# Patient Record
Sex: Male | Born: 1995 | Hispanic: No | Marital: Single | Smoking: Never smoker
Health system: Southern US, Community
[De-identification: ages and names within clinical notes are randomized; demographics above are authoritative.]

---
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
## 2018-06-02 ENCOUNTER — Emergency Department (HOSPITAL_COMMUNITY): Payer: Self-pay

## 2018-06-02 ENCOUNTER — Emergency Department (HOSPITAL_COMMUNITY)
Admission: EM | Admit: 2018-06-02 | Discharge: 2018-06-02 | Disposition: A | Discharge status: Home / Self Care | Payer: Self-pay | Attending: Emergency Medicine | Admitting: Emergency Medicine

## 2018-06-02 ENCOUNTER — Encounter (HOSPITAL_COMMUNITY): Payer: Self-pay | Admitting: Emergency Medicine

## 2018-06-02 DIAGNOSIS — J069 Acute upper respiratory infection, unspecified: Secondary | ICD-10-CM | POA: Insufficient documentation

## 2018-06-02 DIAGNOSIS — B9789 Other viral agents as the cause of diseases classified elsewhere: Secondary | ICD-10-CM | POA: Insufficient documentation

## 2018-06-02 LAB — GROUP A STREP BY PCR: Group A Strep by PCR: NOT DETECTED

## 2018-06-02 MED ORDER — FLUTICASONE PROPIONATE 50 MCG/ACT NA SUSP: 1.0000 | Freq: Every day | NASAL | 0 refills | Status: AC

## 2018-06-02 MED ORDER — BENZONATATE 100 MG PO CAPS: 100.0000 mg | ORAL_CAPSULE | Freq: Three times a day (TID) | ORAL | 0 refills | Status: AC

## 2018-06-02 NOTE — ED Notes (Signed)
Pt alert and oriented in NAD. Pt verbalized understanding of discharge instructions. 

## 2018-06-02 NOTE — ED Triage Notes (Signed)
Pt states he has had cough/nasal congestion/ scratchy throat for 3 days.

## 2018-06-02 NOTE — ED Notes (Signed)
Patient transported to X-ray 

## 2018-06-02 NOTE — ED Provider Notes (Signed)
[facility] EMERGENCY DEPARTMENT Provider Note   CSN: [id number] Arrival date & time: 06/02/18  7829     History   Chief Complaint Chief Complaint  Patient presents with  . Nasal Congestion    HPI Jose Reid is a 22 y.o. male who presents for evaluation of 4 days of nasal congestion, rhinorrhea, cough, sore throat.  Patient states the cough is productive of green and yellow sputum.  Patient reports he has had some postnasal drip that has caused him to have sore throat.  He states that he has been able to eat and drink without any difficulty but does report that his pain is worse with swallowing.  He is tolerating secretions without any difficulty.  Patient states he took over-the-counter TheraFlu with no improvement in his symptoms prompting ED visit.  Patient states that he has had some associated chest soreness with his cough.  Patient denies any fevers, difficulty breathing, abdominal pain, nausea/vomiting.  The history is provided by the patient.    History reviewed. No pertinent past medical history.  There are no active problems to display for this patient.   History reviewed. No pertinent surgical history.      Home Medications    Prior to Admission medications   Medication Sig Start Date End Date Taking? Authorizing Provider  benzonatate (TESSALON) 100 MG capsule Take 1 capsule (100 mg total) by mouth every 8 (eight) hours. 06/02/18   Maxwell Caul, PA-C  fluticasone (FLONASE) 50 MCG/ACT nasal spray Place 1 spray into both nostrils daily. 06/02/18   Maxwell Caul, PA-C    Family History History reviewed. No pertinent family history.  Social History Social History   Tobacco Use  . Smoking status: Never Smoker  . Smokeless tobacco: Never Used  Substance Use Topics  . Alcohol use: Yes  . Drug use: Yes    Types: Marijuana     Allergies   Patient has no known allergies.   Review of Systems Review of Systems  Constitutional:  Negative for fever.  HENT: Positive for congestion and sore throat. Negative for drooling and trouble swallowing.   Respiratory: Positive for cough.   Cardiovascular: Negative for chest pain.  Gastrointestinal: Negative for abdominal pain, nausea and vomiting.     Physical Exam Updated Vital Signs BP 121/77 (BP Location: Right Arm)   Pulse 75   Temp 98.1 F (36.7 C) (Oral)   Resp 18   Ht 5\' 7"  (1.702 m)   Wt 65.8 kg (145 lb)   SpO2 99%   BMI 22.71 kg/m   Physical Exam  Constitutional: He appears well-developed and well-nourished.  HENT:  Head: Normocephalic and atraumatic.  Nose: Nose normal.  Mouth/Throat: Uvula is midline and mucous membranes are normal. No trismus in the jaw. Posterior oropharyngeal erythema present.  Airways patent, phonation is intact.  Posterior oropharynx slightly erythematous.  No edema.  No exudates.  Uvula is midline.  No trismus.  Eyes: Conjunctivae and EOM are normal. Right eye exhibits no discharge. Left eye exhibits no discharge. No scleral icterus.  Pulmonary/Chest: Effort normal and breath sounds normal.  Lungs clear to auscultation bilaterally.  Symmetric chest rise.  No wheezing, rales, rhonchi.  Neurological: He is alert.  Skin: Skin is warm and dry.  Psychiatric: He has a normal mood and affect. His speech is normal and behavior is normal.  Nursing note and vitals reviewed.    ED Treatments / Results  Labs (all labs ordered are listed, but only  abnormal results are displayed) Labs Reviewed  GROUP A STREP BY PCR  CULTURE, GROUP A STREP [facility]    EKG None  Radiology Dg Chest 2 View  Result Date: 06/02/2018 CLINICAL DATA:  Chest pain, shortness of breath. EXAM: CHEST - 2 VIEW COMPARISON:  None. FINDINGS: The heart size and mediastinal contours are within normal limits. Both lungs are clear. No pneumothorax or pleural effusion is noted. The visualized skeletal structures are unremarkable. IMPRESSION: No active cardiopulmonary  disease. Electronically Signed   By: Lupita RaiderJames  Green Jr, M.D.   On: 06/02/2018 12:11    Procedures Procedures (including critical care time)  Medications Ordered in ED Medications - No data to display   Initial Impression / Assessment and Plan / ED Course  I have reviewed the triage vital signs and the nursing notes.  Pertinent labs & imaging results that were available during my care of the patient were reviewed by me and considered in my medical decision making (see chart for details).     22 year old male who presents for evaluation of 4 days of nasal congestion, rhinorrhea, sore throat.  No fevers, difficulty breathing.  Reports also cough and associated chest soreness.  Cough is productive.  Patient's been able to tolerate secretions without any difficulty. Patient is afebrile, non-toxic appearing, sitting comfortably on examination table. Vital signs reviewed and stable.  On exam, lungs clear to auscultation bilaterally.  No evidence of respiratory distress.  Posterior pharyngeal slightly erythematous but no evidence of exudates, edema.  History/physical exam is not concerning for Ludwig angina or peritonsillar abscess.  Consider viral URI versus bronchitis.  Low suspicion for pneumonia but also consideration.  Plan for rapid strep, chest x-ray.  Rapid strep reviewed. Negative. CXR reviewed. Negative for any acute infectious etiology.  Discussed results with patient.  Will plan to provide supportive at home therapies for patient.  Encourage primary care follow-up. Patient had ample opportunity for questions and discussion. All patient's questions were answered with full understanding. Strict return precautions discussed. Patient expresses understanding and agreement to plan.    Final Clinical Impressions(s) / ED Diagnoses   Final diagnoses:  Viral URI with cough    ED Discharge Orders        Ordered    fluticasone (FLONASE) 50 MCG/ACT nasal spray  Daily     06/02/18 1244     benzonatate (TESSALON) 100 MG capsule  Every 8 hours     06/02/18 1244       Maxwell CaulLayden, Leng Montesdeoca A, PA-C 06/02/18 1250    Mancel BaleWentz, Elliott, MD 06/03/18 1523

## 2018-06-02 NOTE — Discharge Instructions (Signed)
You can take Tylenol or Ibuprofen as directed for pain. You can alternate Tylenol and Ibuprofen every 4 hours. If you take Tylenol at 1pm, then you can take Ibuprofen at 5pm. Then you can take Tylenol again at 9pm.   He continues Tessalon Perles for the cough.  Make sure you are staying hydrated with plenty of fluids.  Return to emergency department for any fever, vomiting, inability to eat or drink anything, difficulty breathing or any other worsening concerning symptoms.

## 2019-11-27 ENCOUNTER — Other Ambulatory Visit: Payer: Self-pay

## 2019-11-27 ENCOUNTER — Emergency Department (HOSPITAL_COMMUNITY)
Admission: EM | Admit: 2019-11-27 | Discharge: 2019-11-27 | Disposition: A | Discharge status: Home / Self Care | Payer: Self-pay | Attending: Emergency Medicine | Admitting: Emergency Medicine

## 2019-11-27 ENCOUNTER — Encounter (HOSPITAL_COMMUNITY): Payer: Self-pay | Admitting: *Deleted

## 2019-11-27 DIAGNOSIS — K0889 Other specified disorders of teeth and supporting structures: Secondary | ICD-10-CM | POA: Insufficient documentation

## 2019-11-27 DIAGNOSIS — Z79899 Other long term (current) drug therapy: Secondary | ICD-10-CM | POA: Insufficient documentation

## 2019-11-27 MED ORDER — PENICILLIN V POTASSIUM 250 MG PO TABS
500.0000 mg | ORAL_TABLET | Freq: Once | ORAL | Status: AC
  Administered 2019-11-27: 05:00:00 500 mg via ORAL
  Filled 2019-11-27: qty 2

## 2019-11-27 MED ORDER — IBUPROFEN 800 MG PO TABS: 800.0000 mg | ORAL_TABLET | Freq: Three times a day (TID) | ORAL | 0 refills | Status: DC | PRN

## 2019-11-27 MED ORDER — IBUPROFEN 800 MG PO TABS
800.0000 mg | ORAL_TABLET | Freq: Once | ORAL | Status: AC
  Administered 2019-11-27: 800 mg via ORAL
  Filled 2019-11-27: qty 1

## 2019-11-27 MED ORDER — PENICILLIN V POTASSIUM 500 MG PO TABS: 500.0000 mg | ORAL_TABLET | Freq: Four times a day (QID) | ORAL | 0 refills | Status: DC

## 2019-11-27 NOTE — ED Notes (Signed)
Toothache for months

## 2019-11-27 NOTE — ED Triage Notes (Signed)
Left upper dental pain "for months".

## 2019-11-27 NOTE — ED Provider Notes (Signed)
TIME SEEN: 5:26 AM  CHIEF COMPLAINT: Upper left dental pain x2 months  HPI: Patient is a 23 year old male with no significant past medical history who presents to the emergency department left upper molar pain for the past 2 months.  States pain worsened today and he thought his tooth could be infected and is requesting antibiotics.  States it is causing him to have a headache.  No fevers, no facial swelling, difficulty swallowing, difficulty breathing, difficulty speaking.  Does not have a dentist.  ROS: See HPI Constitutional: no fever  Eyes: no drainage  ENT: no runny nose   Cardiovascular:  no chest pain  Resp: no SOB  GI: no vomiting GU: no dysuria Integumentary: no rash  Allergy: no hives  Musculoskeletal: no leg swelling  Neurological: no slurred speech ROS otherwise negative  PAST MEDICAL HISTORY/PAST SURGICAL HISTORY:  History reviewed. No pertinent past medical history.  MEDICATIONS:  Prior to Admission medications   Medication Sig Start Date End Date Taking? Authorizing Provider  benzonatate (TESSALON) 100 MG capsule Take 1 capsule (100 mg total) by mouth every 8 (eight) hours. 06/02/18   Volanda Napoleon, PA-C  fluticasone (FLONASE) 50 MCG/ACT nasal spray Place 1 spray into both nostrils daily. 06/02/18   Volanda Napoleon, PA-C  ibuprofen (ADVIL) 800 MG tablet Take 1 tablet (800 mg total) by mouth every 8 (eight) hours as needed for mild pain. 11/27/19   Othello Dickenson, Delice Bison, DO  penicillin v potassium (VEETID) 500 MG tablet Take 1 tablet (500 mg total) by mouth 4 (four) times daily. 11/27/19   Daneil Beem, Delice Bison, DO    ALLERGIES:  No Known Allergies  SOCIAL HISTORY:  Social History   Tobacco Use  . Smoking status: Never Smoker  . Smokeless tobacco: Never Used  Substance Use Topics  . Alcohol use: Yes    FAMILY HISTORY: No family history on file.  EXAM: BP 130/80 (BP Location: Right Arm)   Pulse 80   Temp 98.7 F (37.1 C) (Oral)   Resp 18   SpO2 98%   CONSTITUTIONAL: Alert and oriented and responds appropriately to questions. Well-appearing; well-nourished HEAD: Normocephalic EYES: Conjunctivae clear, pupils appear equal, EOM appear intact ENT: normal nose; moist mucous membranes; No pharyngeal erythema or petechiae, no tonsillar hypertrophy or exudate, no uvular deviation, no unilateral swelling, no trismus or drooling, no muffled voice, normal phonation, no stridor, patient has dental decay noted to the left upper posterior molar without swelling around the gumline, no drainable dental abscess noted, no Ludwig's angina, tongue sits flat in the bottom of the mouth, no angioedema, no facial erythema or warmth, no facial swelling; no pain with movement of the neck, no cervical LAD. NECK: Supple, normal ROM, no lymphadenopathy CARD: RRR; S1 and S2 appreciated; no murmurs, no clicks, no rubs, no gallops RESP: Normal chest excursion without splinting or tachypnea; breath sounds clear and equal bilaterally; no wheezes, no rhonchi, no rales, no hypoxia or respiratory distress, speaking full sentences ABD/GI: Abdomen nondistended BACK: Normal range of motion EXT: Normal ROM in all joints; no deformity noted, no edema; no cyanosis SKIN: Normal color for age and race; warm; no rash on exposed skin NEURO: Moves all extremities equally PSYCH: The patient's mood and manner are appropriate.   MEDICAL DECISION MAKING: Patient here with dental pain likely from dental caries.  No drainable abscess on exam.  No Ludwick's angina.  We will start him on penicillin prophylactically recommended alternating Tylenol and ibuprofen.  Will give outpatient dental resources.  Patient well-appearing, nontoxic, afebrile.  He is comfortable with this plan.  At this time, I do not feel there is any life-threatening condition present. I have reviewed, interpreted and discussed all results (EKG, imaging, lab, urine as appropriate) and exam findings with patient/family. I have  reviewed nursing notes and appropriate previous records.  I feel the patient is safe to be discharged home without further emergent workup and can continue workup as an outpatient as needed. Discussed usual and customary return precautions. Patient/family verbalize understanding and are comfortable with this plan.  Outpatient follow-up has been provided as needed. All questions have been answered.    SHIGEO BAUGH was evaluated in Emergency Department on 11/27/2019 for the symptoms described in the history of present illness. He was evaluated in the context of the global COVID-19 pandemic, which necessitated consideration that the patient might be at risk for infection with the SARS-CoV-2 virus that causes COVID-19. Institutional protocols and algorithms that pertain to the evaluation of patients at risk for COVID-19 are in a state of rapid change based on information released by regulatory bodies including the CDC and federal and state organizations. These policies and algorithms were followed during the patient's care in the ED.  Patient was seen wearing N95, face shield, gloves.    Zair Borawski, Layla Maw, DO 11/27/19 [phone]

## 2019-12-12 IMAGING — DX DG CHEST 2V
2 series · 2 of 2 positions shown · non-contrast
Comparison: None.

CLINICAL DATA: Chest pain, shortness of breath.

EXAM:
CHEST - 2 VIEW

[chest pa]
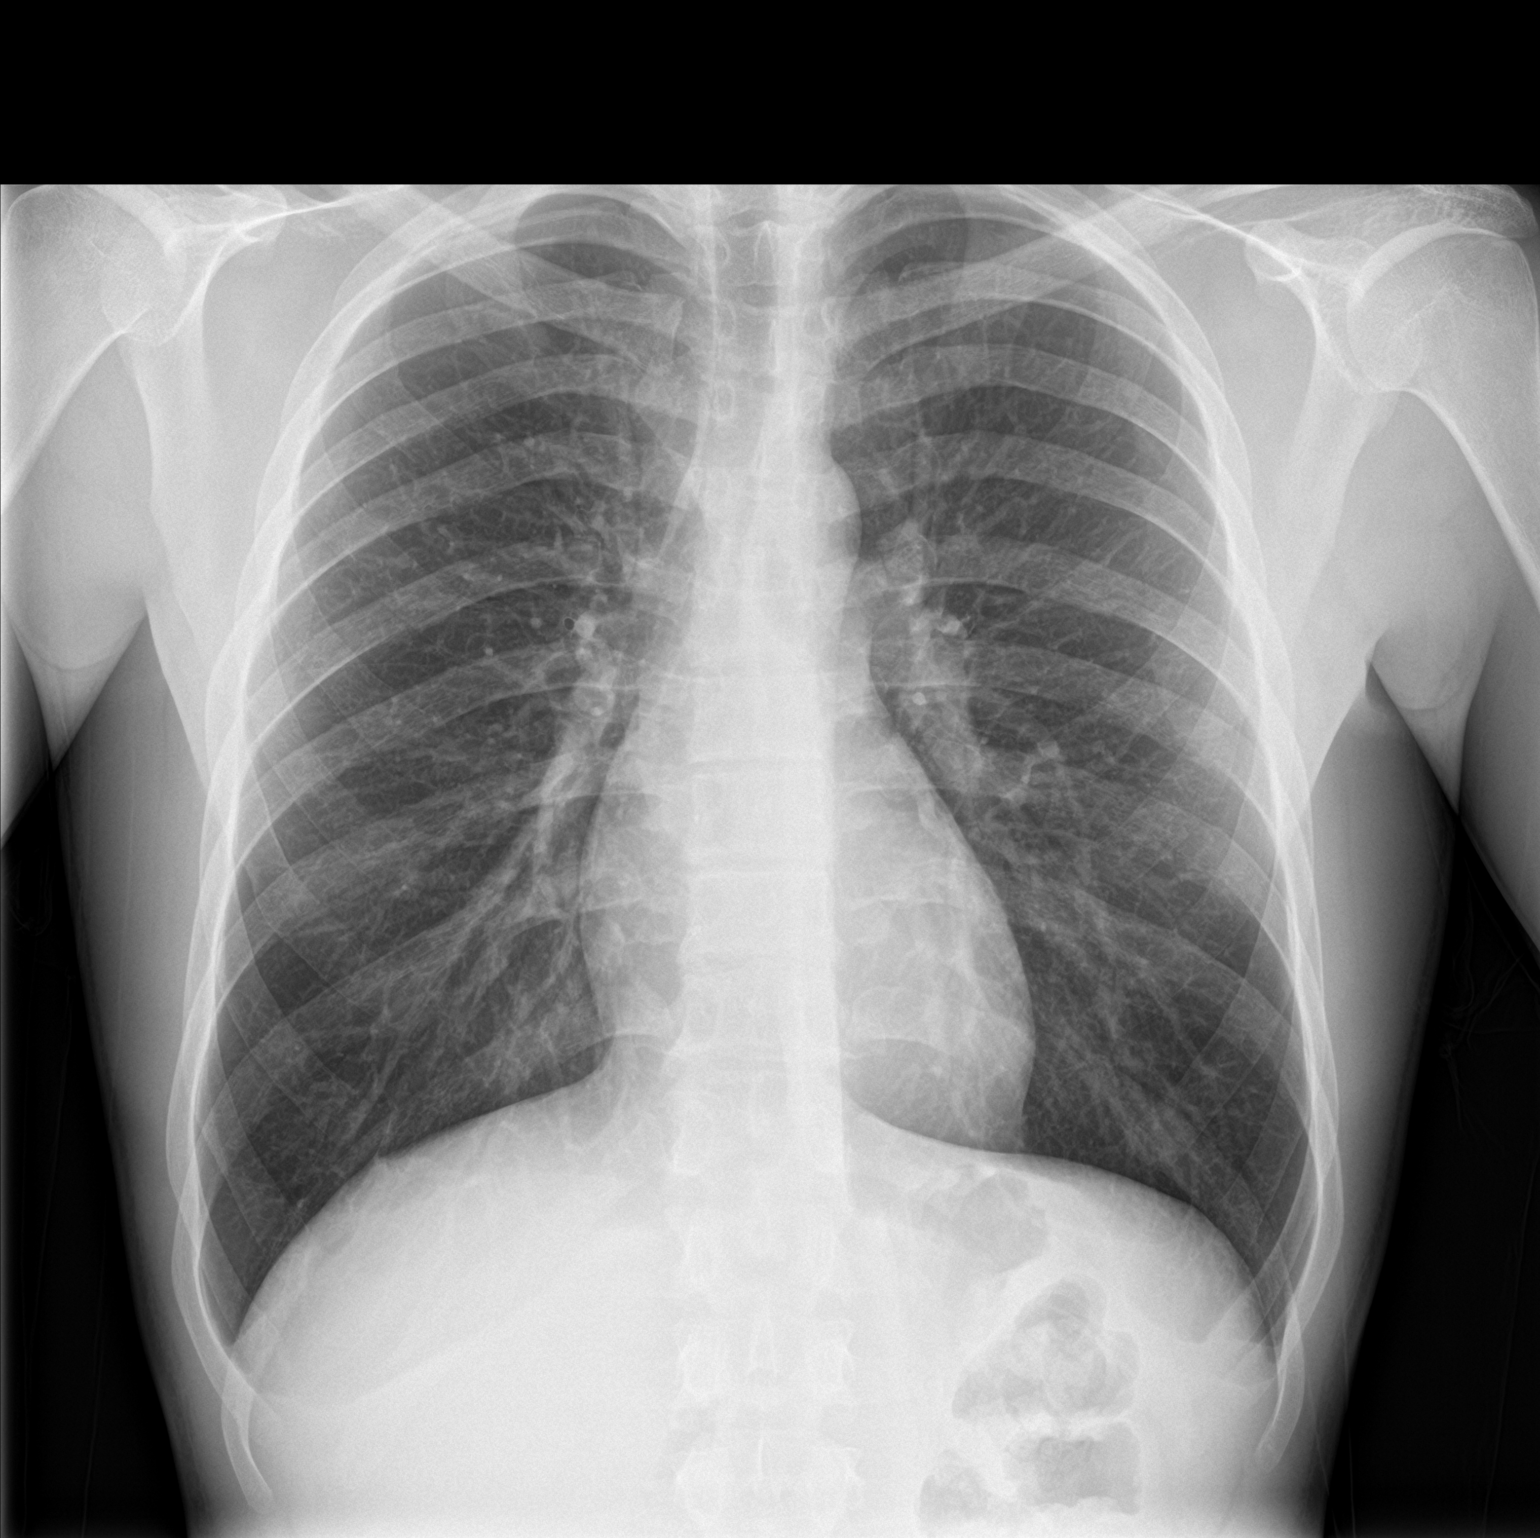

[chest lat]
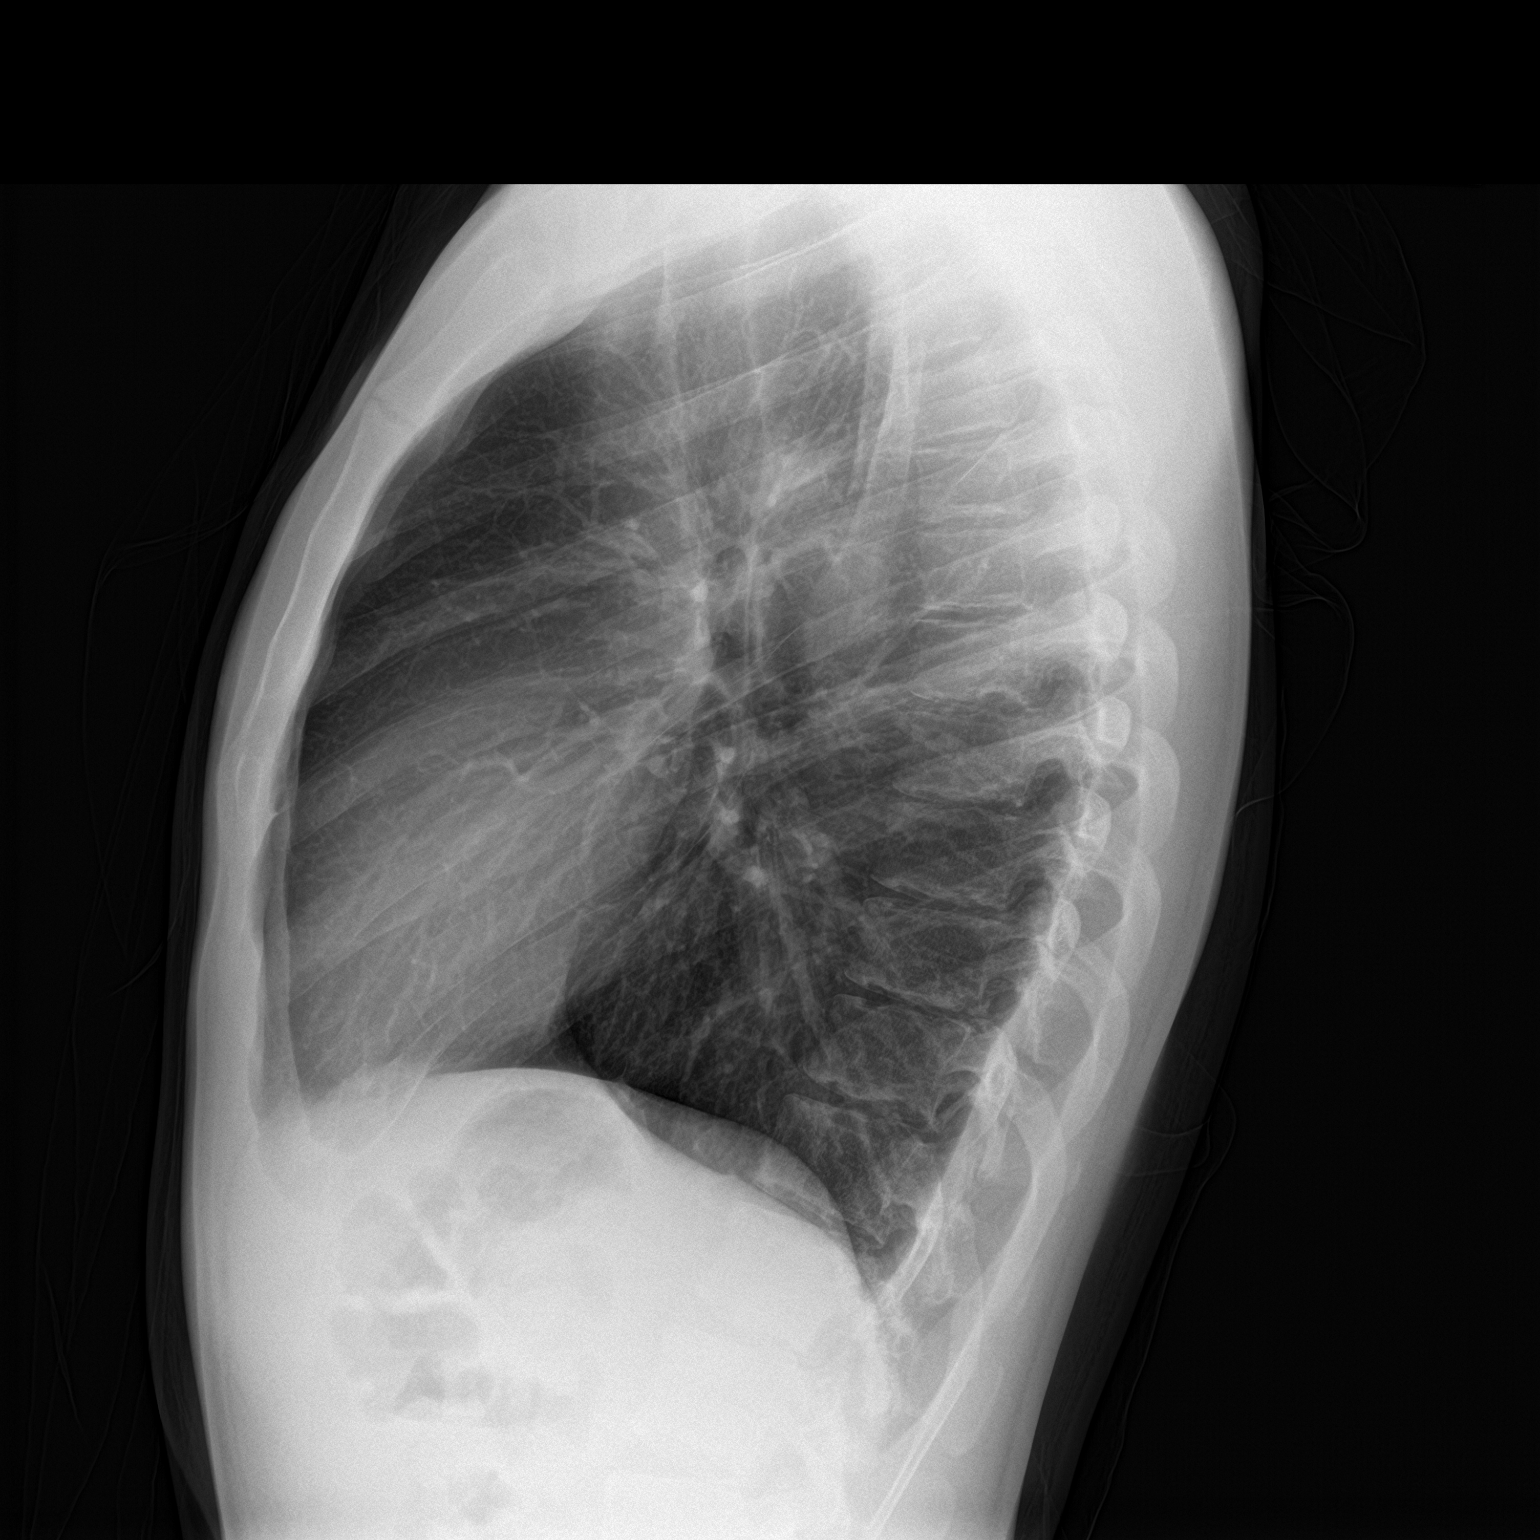

[2 of 2 positions shown; findings below may reference images not displayed]

FINDINGS: The heart size and mediastinal contours are within normal limits.
Both lungs are clear. No pneumothorax or pleural effusion is noted.
The visualized skeletal structures are unremarkable.
IMPRESSION: No active cardiopulmonary disease.

## 2021-07-29 ENCOUNTER — Emergency Department (HOSPITAL_COMMUNITY)
Admission: EM | Admit: 2021-07-29 | Discharge: 2021-07-29 | Disposition: A | Discharge status: Home / Self Care | Payer: Self-pay | Attending: Emergency Medicine | Admitting: Emergency Medicine

## 2021-07-29 DIAGNOSIS — K0889 Other specified disorders of teeth and supporting structures: Secondary | ICD-10-CM | POA: Insufficient documentation

## 2021-07-29 MED ORDER — CHLORHEXIDINE GLUCONATE 0.12 % MT SOLN: 15.0000 mL | Freq: Two times a day (BID) | OROMUCOSAL | 0 refills | Status: AC

## 2021-07-29 NOTE — ED Provider Notes (Signed)
[facility] EMERGENCY DEPARTMENT Provider Note   CSN: [id number] Arrival date & time: 07/29/21  0208     History No chief complaint on file.   Jose Reid is a 25 y.o. male.  Jose Reid is a 25 y.o. male otherwise healthy, presents to the ED for evaluation of dental pain.  He reports he has been having issues with his left upper most posterior molar which is broken and decaying at the gumline.  He recently saw a dental clinic for this and was prescribed amoxicillin which she took for 7 days and seemed to improve his pain, completed course of antibiotics yesterday and now pain seems to be returning tonight.  He did take 1 dose of 800 mg ibuprofen with minimal relief in pain and has tried Orajel without much improvement.  He is concerned that pain returned soon as he completed antibiotics.  Patient not able to get an appointment with the dental clinic to have the tooth until September.  No fevers or chills.  No facial swelling.  No difficulty swallowing.  No other aggravating or alleviating factors.  The history is provided by the patient.      No past medical history on file.  There are no problems to display for this patient.   No past surgical history on file.     No family history on file.  Social History   Tobacco Use   Smoking status: Never   Smokeless tobacco: Never  Substance Use Topics   Alcohol use: Yes   Drug use: Yes    Types: Marijuana    Home Medications Prior to Admission medications   Medication Sig Start Date End Date Taking? Authorizing Provider  chlorhexidine (PERIDEX) 0.12 % solution Use as directed 15 mLs in the mouth or throat 2 (two) times daily. 07/29/21  Yes Dartha Lodge, PA-C  benzonatate (TESSALON) 100 MG capsule Take 1 capsule (100 mg total) by mouth every 8 (eight) hours. 06/02/18   Maxwell Caul, PA-C  fluticasone (FLONASE) 50 MCG/ACT nasal spray Place 1 spray into both nostrils daily. 06/02/18   Maxwell Caul,  PA-C  ibuprofen (ADVIL) 800 MG tablet Take 1 tablet (800 mg total) by mouth every 8 (eight) hours as needed for mild pain. 11/27/19   Ward, Layla Maw, DO  penicillin v potassium (VEETID) 500 MG tablet Take 1 tablet (500 mg total) by mouth 4 (four) times daily. 11/27/19   Ward, Layla Maw, DO    Allergies    Patient has no known allergies.  Review of Systems   Review of Systems  Constitutional:  Negative for chills and fever.  HENT:  Positive for dental problem. Negative for facial swelling and trouble swallowing.   Gastrointestinal:  Negative for nausea and vomiting.  All other systems reviewed and are negative.  Physical Exam Updated Vital Signs BP 119/71 (BP Location: Left Arm)   Pulse (!) 50   Temp 98.7 F (37.1 C) (Oral)   Resp 18   SpO2 96%   Physical Exam Vitals and nursing note reviewed.  Constitutional:      General: He is not in acute distress.    Appearance: Normal appearance. He is well-developed and normal weight. He is not ill-appearing or diaphoretic.  HENT:     Head: Normocephalic and atraumatic.     Mouth/Throat:     Comments: Left upper most posterior molar with large amount of decay in particular at the gumline, no surrounding swelling or signs of  abscess.  Tooth tender to palpation.  No swelling of the roof of the mouth.  Posterior oropharynx clear, normal phonation, tolerating secretions, no torticollis. Eyes:     General:        Right eye: No discharge.        Left eye: No discharge.  Pulmonary:     Effort: Pulmonary effort is normal. No respiratory distress.  Musculoskeletal:     Cervical back: Neck supple. No rigidity.  Skin:    General: Skin is warm and dry.  Neurological:     Mental Status: He is alert and oriented to person, place, and time.     Coordination: Coordination normal.  Psychiatric:        Mood and Affect: Mood normal.        Behavior: Behavior normal.    ED Results / Procedures / Treatments   Labs (all labs ordered are listed,  but only abnormal results are displayed) Labs Reviewed - No data to display  EKG None  Radiology No results found.  Procedures Procedures   Medications Ordered in ED Medications - No data to display  ED Course  I have reviewed the triage vital signs and the nursing notes.  Pertinent labs & imaging results that were available during my care of the patient were reviewed by me and considered in my medical decision making (see chart for details).    MDM Rules/Calculators/A&P                          25 year old male presents with recurrent dental pain, just completed course of amoxicillin which had improved pain, and tonight pain returned, took ibuprofen prior to presenting.  Do not see signs of abscess and I have low suspicion for recurrent infection, patient has significant decay of tooth likely with exposed nerve roots leading to persistent pain.  Ultimately need to follow-up with a dentist.  Provided benzocaine gel with significant pain relief here in the ED.  Provided information for dental follow-up and discussed appropriate pain management and symptom control at home.  Will prescribe Peridex mouthwash to help prevent recurrent infection.  Patient expresses understanding and agreement.  Discharged home in good condition.  Final Clinical Impression(s) / ED Diagnoses Final diagnoses:  Pain, dental    Rx / DC Orders ED Discharge Orders          Ordered    chlorhexidine (PERIDEX) 0.12 % solution  2 times daily        07/29/21 0244             Dartha Lodge, PA-C 07/29/21 0251    Zadie Rhine, MD 07/29/21 [phone]

## 2021-07-29 NOTE — ED Triage Notes (Signed)
Pt here with c/o dental pain pt has appointment to pull tooth in sept, has just finished his antibiotic

## 2021-07-29 NOTE — Discharge Instructions (Addendum)
Use ibuprofen 800 mg every 8 hours and Tylenol 1000 mg every 6 hours to help control pain.  You can also apply Orajel directly with a Q-tip.  Use prescribed antibacterial mouthwash to help prevent recurrent infection.  Follow-up with dentist for further management.

## 2021-08-01 ENCOUNTER — Encounter (HOSPITAL_COMMUNITY): Payer: Self-pay | Admitting: Emergency Medicine

## 2021-08-01 ENCOUNTER — Emergency Department (HOSPITAL_COMMUNITY)
Admission: EM | Admit: 2021-08-01 | Discharge: 2021-08-02 | Disposition: A | Discharge status: Home / Self Care | Payer: No Typology Code available for payment source | Attending: Emergency Medicine | Admitting: Emergency Medicine

## 2021-08-01 DIAGNOSIS — Y92410 Unspecified street and highway as the place of occurrence of the external cause: Secondary | ICD-10-CM | POA: Insufficient documentation

## 2021-08-01 DIAGNOSIS — S161XXA Strain of muscle, fascia and tendon at neck level, initial encounter: Secondary | ICD-10-CM | POA: Diagnosis not present

## 2021-08-01 DIAGNOSIS — S199XXA Unspecified injury of neck, initial encounter: Secondary | ICD-10-CM | POA: Diagnosis present
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
## 2021-08-02 ENCOUNTER — Other Ambulatory Visit: Payer: Self-pay

## 2021-08-02 MED ORDER — CYCLOBENZAPRINE HCL 10 MG PO TABS: 10.0000 mg | ORAL_TABLET | Freq: Three times a day (TID) | ORAL | 0 refills | Status: DC | PRN

## 2021-08-02 MED ORDER — IBUPROFEN 800 MG PO TABS: 800.0000 mg | ORAL_TABLET | Freq: Three times a day (TID) | ORAL | 0 refills | Status: AC

## 2021-08-02 MED ORDER — KETOROLAC TROMETHAMINE 30 MG/ML IJ SOLN
30.0000 mg | Freq: Once | INTRAMUSCULAR | Status: AC
  Administered 2021-08-02: 30 mg via INTRAMUSCULAR
  Filled 2021-08-02: qty 1
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
## 2022-10-23 ENCOUNTER — Emergency Department (HOSPITAL_BASED_OUTPATIENT_CLINIC_OR_DEPARTMENT_OTHER): Payer: Commercial Managed Care - HMO

## 2022-10-23 ENCOUNTER — Other Ambulatory Visit: Payer: Self-pay

## 2022-10-23 ENCOUNTER — Encounter (HOSPITAL_BASED_OUTPATIENT_CLINIC_OR_DEPARTMENT_OTHER): Payer: Self-pay | Admitting: Emergency Medicine

## 2022-10-23 ENCOUNTER — Emergency Department (HOSPITAL_BASED_OUTPATIENT_CLINIC_OR_DEPARTMENT_OTHER)
Admission: EM | Admit: 2022-10-23 | Discharge: 2022-10-23 | Disposition: A | Discharge status: Home / Self Care | Payer: Commercial Managed Care - HMO | Attending: Emergency Medicine | Admitting: Emergency Medicine

## 2022-10-23 DIAGNOSIS — S0990XA Unspecified injury of head, initial encounter: Secondary | ICD-10-CM | POA: Diagnosis present

## 2022-10-23 DIAGNOSIS — Y92410 Unspecified street and highway as the place of occurrence of the external cause: Secondary | ICD-10-CM | POA: Insufficient documentation

## 2022-10-23 DIAGNOSIS — S161XXA Strain of muscle, fascia and tendon at neck level, initial encounter: Secondary | ICD-10-CM | POA: Insufficient documentation

## 2022-10-23 MED ORDER — CYCLOBENZAPRINE HCL 5 MG PO TABS: 5.0000 mg | ORAL_TABLET | Freq: Three times a day (TID) | ORAL | 0 refills | Status: AC | PRN

## 2022-10-23 MED ORDER — IBUPROFEN 800 MG PO TABS
800.0000 mg | ORAL_TABLET | Freq: Once | ORAL | Status: AC
  Administered 2022-10-23: 800 mg via ORAL
  Filled 2022-10-23: qty 1
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
## 2024-05-19 ENCOUNTER — Encounter (HOSPITAL_COMMUNITY): Payer: Self-pay

## 2024-05-19 ENCOUNTER — Emergency Department (HOSPITAL_COMMUNITY)
Admission: EM | Admit: 2024-05-19 | Discharge: 2024-05-19 | Disposition: A | Discharge status: Home / Self Care | Payer: Self-pay | Attending: Emergency Medicine | Admitting: Emergency Medicine

## 2024-05-19 DIAGNOSIS — R197 Diarrhea, unspecified: Secondary | ICD-10-CM | POA: Insufficient documentation

## 2024-05-19 DIAGNOSIS — R1012 Left upper quadrant pain: Secondary | ICD-10-CM | POA: Insufficient documentation

## 2024-05-19 DIAGNOSIS — R112 Nausea with vomiting, unspecified: Secondary | ICD-10-CM | POA: Insufficient documentation

## 2024-05-19 NOTE — ED Triage Notes (Signed)
 Pt c/o lower abdominal discomfort x 3 days and emesis x2 days which has resolved.  Pain score 3/10.  Pt reports he needs note to return to work, because he missed yesterday.

## 2024-05-19 NOTE — Discharge Instructions (Signed)
 Thank you for let us  evaluate you today.  I provided you with primary care provider recommendation for you to establish care for routine medical complaints, annual visits.  Most importantly, make sure that you are maintaining oral hydration with water, Gatorade, Pedialyte, chicken broth.  Please slowly introduce foods back into your diet  Return to emergency department if you experience intractable vomiting causing severe dehydration, worsening of abdominal pain

## 2024-05-19 NOTE — ED Provider Notes (Signed)
 [place] EMERGENCY DEPARTMENT AT [facility] Provider Note   CSN: [id number] Arrival date & time: 05/19/24  1447     History  Chief Complaint  Patient presents with   Needs A Work Note   Abdominal Pain    Jose Reid is a 28 y.o. male with no chronic past medical history presents emergency department for work note.  He reports that he had upper abdominal pain, nausea, vomiting, diarrhea that started on Monday following drinking orange juice.  Reports that his manager sent him home yesterday due to vomiting and is requesting a work note for then.  Patient Dors that he has been vomiting and having diarrhea twice a day for the past 2 days.  Today, he was able to eat a biscuit and have electrolyte drink without vomiting.  Reports improvement of symptoms today.  Denies fever, testicular symptoms, penile symptoms, urinary symptoms.    Abdominal Pain      Home Medications Prior to Admission medications   Medication Sig Start Date End Date Taking? Authorizing Provider  benzonatate  (TESSALON ) 100 MG capsule Take 1 capsule (100 mg total) by mouth every 8 (eight) hours. 06/02/18   Layden, Lindsey A, PA-C  chlorhexidine  (PERIDEX ) 0.12 % solution Use as directed 15 mLs in the mouth or throat 2 (two) times daily. 07/29/21   Kehrli, Kelsey F, PA-C  cyclobenzaprine  (FLEXERIL ) 5 MG tablet Take 1 tablet (5 mg total) by mouth 3 (three) times daily as needed. 10/23/22   Dalene Duck, MD  fluticasone  (FLONASE ) 50 MCG/ACT nasal spray Place 1 spray into both nostrils daily. 06/02/18   Layden, Lindsey A, PA-C  ibuprofen  (ADVIL ) 800 MG tablet Take 1 tablet (800 mg total) by mouth 3 (three) times daily. 08/02/21   Sherel Dikes, PA-C  penicillin  v potassium (VEETID) 500 MG tablet Take 1 tablet (500 mg total) by mouth 4 (four) times daily. 11/27/19   Ward, Clover Dao, DO      Allergies    Patient has no known allergies.    Review of Systems   Review of Systems  Gastrointestinal:   Positive for abdominal pain.    Physical Exam Updated Vital Signs BP 124/86 (BP Location: Right Arm)   Pulse 60   Temp 98.3 F (36.8 C) (Oral)   Resp 18   Ht 5\' 5"  (1.651 m)   Wt 65.8 kg   SpO2 99%   BMI 24.13 kg/m  Physical Exam Vitals and nursing note reviewed.  Constitutional:      General: He is not in acute distress.    Appearance: Normal appearance. He is not ill-appearing.     Comments: Not ill-appearing.  Jovial and laughing in room  HENT:     Head: Normocephalic and atraumatic.     Mouth/Throat:     Lips: Pink.     Mouth: Mucous membranes are moist.  Eyes:     Conjunctiva/sclera: Conjunctivae normal.  Cardiovascular:     Rate and Rhythm: Normal rate.     Pulses: Normal pulses.     Heart sounds: No murmur heard. Pulmonary:     Effort: Pulmonary effort is normal. No respiratory distress.     Breath sounds: Normal breath sounds.  Chest:     Chest wall: No tenderness.  Abdominal:     General: Bowel sounds are normal. There is no distension.     Palpations: Abdomen is soft.     Tenderness: There is abdominal tenderness (mild with deep palpation) in the left upper  quadrant. There is no right CVA tenderness, left CVA tenderness, guarding or rebound.     Comments: Nonsurgical abdomen.  No peritoneal signs  Musculoskeletal:     Right lower leg: No edema.     Left lower leg: No edema.  Skin:    Capillary Refill: Capillary refill takes less than 2 seconds.     Coloration: Skin is not jaundiced or pale.  Neurological:     Mental Status: He is alert and oriented to person, place, and time. Mental status is at baseline.     ED Results / Procedures / Treatments   Labs (all labs ordered are listed, but only abnormal results are displayed) Labs Reviewed - No data to display  EKG None  Radiology No results found.  Procedures Procedures    Medications Ordered in ED Medications - No data to display  ED Course/ Medical Decision Making/ A&P                                  Medical Decision Making   Patient presents to the ED for concern of abd pain, NVD, this involves an extensive number of treatment options, and is a complaint that carries with it a high risk of complications and morbidity.  The differential diagnosis includes food poisoning, gastroenteritis, pancreatitis, appendicitis, diverticulitis, bowel obstruction, bowel perforation, gallbladder pathology.  Not an exhaustive list   Co morbidities that complicate the patient evaluation  None   Additional history obtained:  Additional history obtained from  Nursing   External records from outside source obtained and reviewed including triage note     Problem List / ED Course:  Abd pain NVD Patient seems to relate this to orange juice as 4 hours following her issues, he started having NVD.   Fortunately, symptoms have improved today.  Overall well-appearing and jovial. Mucous membranes moist. Passed p.o. challenge earlier today and has been drinking in ED I offered to obtain lab work to ensure no hypokalemia or electrolyte abnormalities.  However patient refuses at this time.  I think this is reasonable as patient's vital signs are all WNL.  Hemodynamically stable with no fever no tachycardia.  He is not exquisitely tender to abdomen with no peritoneal signs so I do not think that imaging is required Discussed importance of oral hydration and slowly introduce foods back into diet.  I also offered Zofran prescription however patient refuses as he reports nausea has improved significantly Provided PCP recommendation to establish care   Reevaluation:  After the interventions noted above, I reevaluated the patient and found that they have :improved    Dispostion:  After consideration of the diagnostic results and the patients response to treatment, I feel that the patent would benefit from outpatient management with establishment of PCP.   Discussed ED workup, disposition,  return to ED precautions with patient who expresses understanding agrees with plan.  All questions answered to their satisfaction.  They are agreeable to plan.  Discharge instructions provided on paperwork  Final Clinical Impression(s) / ED Diagnoses Final diagnoses:  Nausea vomiting and diarrhea    Rx / DC Orders ED Discharge Orders     None         Royann Cords, PA 05/19/24 1524    Albertus Hughs, DO 05/19/24 1713

## 2024-07-06 ENCOUNTER — Emergency Department (HOSPITAL_COMMUNITY): Payer: Self-pay

## 2024-07-06 ENCOUNTER — Other Ambulatory Visit: Payer: Self-pay

## 2024-07-06 ENCOUNTER — Emergency Department (HOSPITAL_COMMUNITY)
Admission: EM | Admit: 2024-07-06 | Discharge: 2024-07-06 | Disposition: A | Discharge status: Home / Self Care | Payer: Self-pay | Attending: Emergency Medicine | Admitting: Emergency Medicine

## 2024-07-06 ENCOUNTER — Encounter (HOSPITAL_COMMUNITY): Payer: Self-pay

## 2024-07-06 DIAGNOSIS — X509XXA Other and unspecified overexertion or strenuous movements or postures, initial encounter: Secondary | ICD-10-CM | POA: Insufficient documentation

## 2024-07-06 DIAGNOSIS — S92354A Nondisplaced fracture of fifth metatarsal bone, right foot, initial encounter for closed fracture: Secondary | ICD-10-CM | POA: Insufficient documentation

## 2024-07-06 DIAGNOSIS — Y9302 Activity, running: Secondary | ICD-10-CM | POA: Insufficient documentation

## 2024-07-06 MED ORDER — KETOROLAC TROMETHAMINE 60 MG/2ML IM SOLN: 30.0000 mg | Freq: Once | INTRAMUSCULAR | Status: DC

## 2024-07-06 MED ORDER — OXYCODONE-ACETAMINOPHEN 5-325 MG PO TABS: 1.0000 | ORAL_TABLET | Freq: Four times a day (QID) | ORAL | 0 refills | Status: AC | PRN

## 2024-07-06 MED ORDER — IBUPROFEN 400 MG PO TABS
400.0000 mg | ORAL_TABLET | Freq: Once | ORAL | Status: AC | PRN
  Administered 2024-07-06: 400 mg via ORAL
  Filled 2024-07-06: qty 1

## 2024-07-06 MED ORDER — ACETAMINOPHEN 500 MG PO TABS
1000.0000 mg | ORAL_TABLET | Freq: Once | ORAL | Status: AC
  Administered 2024-07-06: 1000 mg via ORAL
  Filled 2024-07-06: qty 2

## 2024-07-06 MED ORDER — OXYCODONE-ACETAMINOPHEN 5-325 MG PO TABS
1.0000 | ORAL_TABLET | Freq: Once | ORAL | Status: DC
  Filled 2024-07-06: qty 1

## 2024-07-06 NOTE — Progress Notes (Signed)
 Orthopedic Tech Progress Note Patient Details:  Jose Reid Nov 02, 1996 [id number]  Ortho Devices Type of Ortho Device: Crutches, Postop shoe/boot Ortho Device/Splint Location: RLE Ortho Device/Splint Interventions: Application, Ordered   Post Interventions Patient Tolerated: Well Instructions Provided: Poper ambulation with device  [name] [name] 07/06/2024, 5:33 PM

## 2024-07-06 NOTE — Discharge Instructions (Signed)
 Please follow-up with orthopedics outpatient for continued management.  Percocet has been prescribed for breakthrough pain control, also take Tylenol  and ibuprofen .  Weight-bear as tolerated, you have been placed in a postop shoe for your fifth metatarsal fracture seen on x-ray, utilize crutches as needed for ambulation
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
## 2024-07-06 NOTE — ED Provider Notes (Signed)
 [place] EMERGENCY DEPARTMENT AT [facility] Provider Note   CSN: [id number] Arrival date & time: 07/06/24  1308     Patient presents with: Ankle Pain   Jose Reid is a 28 y.o. male.    Ankle Pain    28 year old male presenting to the emergency department with concern for foot pain.  The patient states that he was running 2 days ago and rolled his ankle and foot.  He has had pain with weightbearing ever since along the lateral aspect of the right foot.  He denies any other injuries or complaints, arrives GCS 15, ABC intact.  Prior to Admission medications   Medication Sig Start Date End Date Taking? Authorizing Provider  benzonatate  (TESSALON ) 100 MG capsule Take 1 capsule (100 mg total) by mouth every 8 (eight) hours. 06/02/18   Layden, Lindsey A, PA-C  chlorhexidine  (PERIDEX ) 0.12 % solution Use as directed 15 mLs in the mouth or throat 2 (two) times daily. 07/29/21   Kehrli, Kelsey F, PA-C  cyclobenzaprine  (FLEXERIL ) 5 MG tablet Take 1 tablet (5 mg total) by mouth 3 (three) times daily as needed. 10/23/22   Patt Alm Macho, MD  fluticasone  (FLONASE ) 50 MCG/ACT nasal spray Place 1 spray into both nostrils daily. 06/02/18   Layden, Lindsey A, PA-C  ibuprofen  (ADVIL ) 800 MG tablet Take 1 tablet (800 mg total) by mouth 3 (three) times daily. 08/02/21   Vicky Lamar, PA-C  penicillin  v potassium (VEETID) 500 MG tablet Take 1 tablet (500 mg total) by mouth 4 (four) times daily. 11/27/19   Ward, Josette SAILOR, DO    Allergies: Patient has no known allergies.    Review of Systems  All other systems reviewed and are negative.   Updated Vital Signs BP (!) 142/89 (BP Location: Right Arm)   Pulse (!) 56   Temp (!) 97.5 F (36.4 C)   Resp 18   Ht 5' 9 (1.753 m)   Wt 63.5 kg   SpO2 100%   BMI 20.67 kg/m   Physical Exam Vitals and nursing note reviewed.  Constitutional:      General: He is not in acute distress. HENT:     Head: Normocephalic and atraumatic.   Eyes:     Conjunctiva/sclera: Conjunctivae normal.     Pupils: Pupils are equal, round, and reactive to light.  Cardiovascular:     Rate and Rhythm: Normal rate and regular rhythm.  Pulmonary:     Effort: Pulmonary effort is normal. No respiratory distress.  Abdominal:     General: There is no distension.     Tenderness: There is no guarding.  Musculoskeletal:        General: Tenderness present. No deformity.     Cervical back: Neck supple.     Comments: 2+ DP pulses, tenderness to palpation about the fifth metatarsal on the right  Skin:    Findings: No lesion or rash.  Neurological:     General: No focal deficit present.     Mental Status: He is alert. Mental status is at baseline.     (all labs ordered are listed, but only abnormal results are displayed) Labs Reviewed - No data to display  EKG: None  Radiology: DG Ankle Complete Right Result Date: 07/06/2024 CLINICAL DATA:  Twisting injury 2 days ago with persistent ankle and foot pain. EXAM: RIGHT ANKLE - COMPLETE 3+ VIEW; RIGHT FOOT COMPLETE - 3+ VIEW COMPARISON:  None Available. FINDINGS: Oblique, mildly displaced acute fracture through the 5th metatarsal  neck demonstrates no intra-articular extension. No other evidence of acute fracture or dislocation. No evidence of acute injury at the ankle. The joint spaces are preserved. There is lateral forefoot soft tissue swelling without evidence of foreign body or soft tissue emphysema. IMPRESSION: Mildly displaced 5th metatarsal neck fracture. No other acute osseous findings. Electronically Signed   By: Elsie Perone M.D.   On: 07/06/2024 14:06   DG Foot Complete Right Result Date: 07/06/2024 CLINICAL DATA:  Twisting injury 2 days ago with persistent ankle and foot pain. EXAM: RIGHT ANKLE - COMPLETE 3+ VIEW; RIGHT FOOT COMPLETE - 3+ VIEW COMPARISON:  None Available. FINDINGS: Oblique, mildly displaced acute fracture through the 5th metatarsal neck demonstrates no intra-articular  extension. No other evidence of acute fracture or dislocation. No evidence of acute injury at the ankle. The joint spaces are preserved. There is lateral forefoot soft tissue swelling without evidence of foreign body or soft tissue emphysema. IMPRESSION: Mildly displaced 5th metatarsal neck fracture. No other acute osseous findings. Electronically Signed   By: Elsie Perone M.D.   On: 07/06/2024 14:06     Procedures   Medications Ordered in the ED  acetaminophen  (TYLENOL ) tablet 1,000 mg (has no administration in time range)  ibuprofen  (ADVIL ) tablet 400 mg (400 mg Oral Given 07/06/24 1328)                                    Medical Decision Making Amount and/or Complexity of Data Reviewed Radiology: ordered.  Risk Prescription drug management.    28 year old male presenting to the emergency department with concern for foot pain.  The patient states that he was running 2 days ago and rolled his ankle and foot.  He has had pain with weightbearing ever since along the lateral aspect of the right foot.  He denies any other injuries or complaints, arrives GCS 15, ABC intact.  On arrival, the patient was vitally stable.  Concern for fracture.  X-ray imaging ankle and right foot: IMPRESSION:  Mildly displaced 5th metatarsal neck fracture. No other acute  osseous findings.   Patient placed in a postop shoe, provided crutches for assistance with ambulation.  Provided Motrin  and Tylenol  for pain control. Patient was prescribed Percocet for pain control, advised outpatient follow-up with orthopedics.     Final diagnoses:  Closed nondisplaced fracture of fifth metatarsal bone of right foot, initial encounter    ED Discharge Orders          Ordered    AMB referral to orthopedics        07/06/24 1721               Jerrol Agent, MD 07/06/24 1724

## 2024-07-12 ENCOUNTER — Emergency Department (HOSPITAL_COMMUNITY)
Admission: EM | Admit: 2024-07-12 | Discharge: 2024-07-12 | Disposition: A | Discharge status: Home / Self Care | Payer: Self-pay | Attending: Emergency Medicine | Admitting: Emergency Medicine

## 2024-07-12 ENCOUNTER — Other Ambulatory Visit: Payer: Self-pay

## 2024-07-12 DIAGNOSIS — Z7689 Persons encountering health services in other specified circumstances: Secondary | ICD-10-CM

## 2024-07-12 DIAGNOSIS — Z0279 Encounter for issue of other medical certificate: Secondary | ICD-10-CM | POA: Insufficient documentation

## 2024-07-12 NOTE — ED Triage Notes (Signed)
 Pt was seen for a right foot fracture on 7/22 and still can't get a shoe on or go to work so he wants a follow up and a new work note if possible.

## 2024-07-12 NOTE — ED Provider Notes (Signed)
 [place] EMERGENCY DEPARTMENT AT [facility] Provider Note   CSN: [id number] Arrival date & time: 07/12/24  1056     Patient presents with: Letter for School/Work   Jose Reid is a 28 y.o. male.   28 year old male requesting work excuse.  Was seen here about a week ago and has not followed with orthopedics yet.  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  Denies any new trauma       Prior to Admission medications   Medication Sig Start Date End Date Taking? Authorizing Provider  benzonatate  (TESSALON ) 100 MG capsule Take 1 capsule (100 mg total) by mouth every 8 (eight) hours. 06/02/18   Layden, Lindsey A, PA-C  chlorhexidine  (PERIDEX ) 0.12 % solution Use as directed 15 mLs in the mouth or throat 2 (two) times daily. 07/29/21   Kehrli, Kelsey F, PA-C  cyclobenzaprine  (FLEXERIL ) 5 MG tablet Take 1 tablet (5 mg total) by mouth 3 (three) times daily as needed. 10/23/22   Patt Alm Macho, MD  fluticasone  (FLONASE ) 50 MCG/ACT nasal spray Place 1 spray into both nostrils daily. 06/02/18   Layden, Lindsey A, PA-C  ibuprofen  (ADVIL ) 800 MG tablet Take 1 tablet (800 mg total) by mouth 3 (three) times daily. 08/02/21   Vicky Lamar, PA-C  oxyCODONE -acetaminophen  (PERCOCET/ROXICET) 5-325 MG tablet Take 1 tablet by mouth every 6 (six) hours as needed for severe pain (pain score 7-10). 07/06/24   Jerrol Agent, MD  penicillin  v potassium (VEETID) 500 MG tablet Take 1 tablet (500 mg total) by mouth 4 (four) times daily. 11/27/19   Ward, Josette SAILOR, DO    Allergies: Patient has no known allergies.    Review of Systems  All other systems reviewed and are negative.   Updated Vital Signs BP 123/77 (BP Location: Right Arm)   Pulse (!) 48   Temp 97.9 F (36.6 C)   Resp 16   SpO2 100%   Physical Exam Vitals and nursing note reviewed.  Constitutional:      General: He is not in acute distress.    Appearance: Normal appearance. He is  well-developed. He is not toxic-appearing.  HENT:     Head: Normocephalic and atraumatic.  Eyes:     General: Lids are normal.     Conjunctiva/sclera: Conjunctivae normal.     Pupils: Pupils are equal, round, and reactive to light.  Neck:     Thyroid: No thyroid mass.     Trachea: No tracheal deviation.  Cardiovascular:     Rate and Rhythm: Normal rate and regular rhythm.     Heart sounds: Normal heart sounds. No murmur heard.    No gallop.  Pulmonary:     Effort: Pulmonary effort is normal. No respiratory distress.     Breath sounds: Normal breath sounds. No stridor. No decreased breath sounds, wheezing, rhonchi or rales.  Abdominal:     General: There is no distension.     Palpations: Abdomen is soft.     Tenderness: There is no abdominal tenderness. There is no rebound.  Musculoskeletal:        General: No tenderness. Normal range of motion.     Cervical back: Normal range of motion and neck supple.     Comments: Right foot with ecchymosis on the lateral aspect of the dorsum of the foot.  Neurovasc intact  Skin:    General: Skin is warm and dry.     Findings: No abrasion  or rash.  Neurological:     Mental Status: He is alert and oriented to person, place, and time. Mental status is at baseline.     GCS: GCS eye subscore is 4. GCS verbal subscore is 5. GCS motor subscore is 6.     Cranial Nerves: No cranial nerve deficit.     Sensory: No sensory deficit.     Motor: Motor function is intact.  Psychiatric:        Attention and Perception: Attention normal.        Speech: Speech normal.        Behavior: Behavior normal.     (all labs ordered are listed, but only abnormal results are displayed) Labs Reviewed - No data to display  EKG: None  Radiology: No results found.   Procedures   Medications Ordered in the ED - No data to display                                  Medical Decision Making  Patient encouraged to follow with orthopedics.  Given work excuse for  a few more days     Final diagnoses:  None    ED Discharge Orders     None          Dasie Faden, MD 07/12/24 1209

## 2024-07-12 NOTE — Discharge Instructions (Signed)
 Follow up with orthopedics

## 2024-07-12 NOTE — ED Notes (Signed)
Pt verbalized understanding of discharge instructions. Pt ambulated from ed with steady gait.  

## 2024-08-11 ENCOUNTER — Ambulatory Visit (HOSPITAL_COMMUNITY): Payer: Self-pay

## 2024-09-11 ENCOUNTER — Other Ambulatory Visit: Payer: Self-pay

## 2024-09-11 ENCOUNTER — Emergency Department (HOSPITAL_COMMUNITY)
Admission: EM | Admit: 2024-09-11 | Discharge: 2024-09-11 | Disposition: A | Discharge status: Home / Self Care | Payer: Self-pay | Attending: Emergency Medicine | Admitting: Emergency Medicine

## 2024-09-11 ENCOUNTER — Encounter (HOSPITAL_COMMUNITY): Payer: Self-pay | Admitting: Pharmacy Technician

## 2024-09-11 DIAGNOSIS — K029 Dental caries, unspecified: Secondary | ICD-10-CM | POA: Insufficient documentation

## 2024-09-11 DIAGNOSIS — K0530 Chronic periodontitis, unspecified: Secondary | ICD-10-CM | POA: Insufficient documentation

## 2024-09-11 MED ORDER — HYDROCODONE-ACETAMINOPHEN 5-325 MG PO TABS: 2.0000 | ORAL_TABLET | ORAL | 0 refills | Status: AC | PRN

## 2024-09-11 MED ORDER — AMOXICILLIN 500 MG PO CAPS: 500.0000 mg | ORAL_CAPSULE | Freq: Three times a day (TID) | ORAL | 0 refills | Status: AC

## 2024-09-11 NOTE — ED Provider Notes (Signed)
 [place] EMERGENCY DEPARTMENT AT [facility] Provider Note   CSN: [id number] Arrival date & time: 09/11/24  1355     Patient presents with: Dental Pain   Jose Reid is a 28 y.o. male who presents due to left maxillary dental pain, notes he has a fractured tooth and has been attempting to get dental care but due to work commitments he has been unable to get a dental appointment thus far.  Has had increasing pain and swelling to the left upper part of his mouth, more pronounced today than previously.  Has not taken any antibiotics recently, using ibuprofen  to help with pain however this has had minimal relief.    Dental Pain      Prior to Admission medications   Medication Sig Start Date End Date Taking? Authorizing Provider  amoxicillin (AMOXIL) 500 MG capsule Take 1 capsule (500 mg total) by mouth 3 (three) times daily for 7 days. 09/11/24 09/18/24 Yes Myriam Dorn BROCKS, PA  HYDROcodone-acetaminophen  (NORCO/VICODIN) 5-325 MG tablet Take 2 tablets by mouth every 4 (four) hours as needed. 09/11/24  Yes Myriam Dorn BROCKS, PA  benzonatate  (TESSALON ) 100 MG capsule Take 1 capsule (100 mg total) by mouth every 8 (eight) hours. 06/02/18   Layden, Lindsey A, PA-C  chlorhexidine  (PERIDEX ) 0.12 % solution Use as directed 15 mLs in the mouth or throat 2 (two) times daily. 07/29/21   Kehrli, Kelsey F, PA-C  cyclobenzaprine  (FLEXERIL ) 5 MG tablet Take 1 tablet (5 mg total) by mouth 3 (three) times daily as needed. 10/23/22   Patt Alm Macho, MD  fluticasone  (FLONASE ) 50 MCG/ACT nasal spray Place 1 spray into both nostrils daily. 06/02/18   Layden, Lindsey A, PA-C  ibuprofen  (ADVIL ) 800 MG tablet Take 1 tablet (800 mg total) by mouth 3 (three) times daily. 08/02/21   Vicky Lamar, PA-C  oxyCODONE -acetaminophen  (PERCOCET/ROXICET) 5-325 MG tablet Take 1 tablet by mouth every 6 (six) hours as needed for severe pain (pain score 7-10). 07/06/24   Jerrol Agent, MD  penicillin  v  potassium (VEETID) 500 MG tablet Take 1 tablet (500 mg total) by mouth 4 (four) times daily. 11/27/19   Ward, Josette SAILOR, DO    Allergies: Patient has no known allergies.    Review of Systems  HENT:  Positive for dental problem.   All other systems reviewed and are negative.   Updated Vital Signs BP 114/76 (BP Location: Right Arm)   Pulse (!) 43   Temp 98 F (36.7 C)   Resp 18   SpO2 99%   Physical Exam Vitals and nursing note reviewed.  Constitutional:      General: He is not in acute distress.    Appearance: He is well-developed.  HENT:     Head: Normocephalic and atraumatic.     Mouth/Throat:     Lips: Pink.     Mouth: Mucous membranes are moist.     Dentition: Abnormal dentition. Dental tenderness present.      Comments: Extensive caries noted to multiple teeth, most prominently to identified molar, dental tenderness appreciated to the same.  No overt pocket of abscess noted on physical exam.  There is notable edema to the left maxilla. Eyes:     Conjunctiva/sclera: Conjunctivae normal.  Cardiovascular:     Rate and Rhythm: Normal rate and regular rhythm.     Heart sounds: No murmur heard. Pulmonary:     Effort: Pulmonary effort is normal. No respiratory distress.     Breath sounds: Normal  breath sounds.  Abdominal:     Palpations: Abdomen is soft.     Tenderness: There is no abdominal tenderness.  Musculoskeletal:        General: No swelling.     Cervical back: Neck supple.  Skin:    General: Skin is warm and dry.     Capillary Refill: Capillary refill takes less than 2 seconds.  Neurological:     Mental Status: He is alert.  Psychiatric:        Mood and Affect: Mood normal.     (all labs ordered are listed, but only abnormal results are displayed) Labs Reviewed - No data to display  EKG: None  Radiology: No results found.   Procedures   Medications Ordered in the ED - No data to display                                  Medical Decision  Making  Secondary to swelling with dental pain and tenderness over the left maxillary molar, no pocket of abscess identified on the physical exam, believe this to be secondary to periodontitis, will prescribe course of amoxicillin as well as short course of hydrocodone to manage pain until he can get to a dental appointment.  He has no concerning signs or symptoms, vital signs have been within normal limits and airway remains patent.  As such, further workup deferred at this time, will manage outpatient as noted with intent to follow-up with dentistry as soon as possible.  Patient understands and agrees has no further concerns at this time.     Final diagnoses:  Periodontitis  Pain due to dental caries    ED Discharge Orders          Ordered    amoxicillin (AMOXIL) 500 MG capsule  3 times daily        09/11/24 1421    HYDROcodone-acetaminophen  (NORCO/VICODIN) 5-325 MG tablet  Every 4 hours PRN        09/11/24 1421               Myriam Dorn BROCKS, [place] 09/11/24 1421    Melvenia Motto, MD 09/11/24 2045

## 2024-09-11 NOTE — ED Triage Notes (Signed)
 PT c/o dental pain x 1 week.

## 2024-09-11 NOTE — Discharge Instructions (Signed)
 As discussed, please follow-up with your dentist as soon as able to have the affected tooth removed.

## 2024-09-11 NOTE — ED Notes (Signed)
 Patient Alert and oriented to baseline. Stable and ambulatory to baseline. Patient verbalized understanding of the discharge instructions.  Patient belongings were taken by the patient.

## 2024-10-03 ENCOUNTER — Other Ambulatory Visit: Payer: Self-pay

## 2024-10-03 ENCOUNTER — Emergency Department (HOSPITAL_COMMUNITY)
Admission: EM | Admit: 2024-10-03 | Discharge: 2024-10-04 | Disposition: A | Discharge status: Home / Self Care | Payer: Self-pay | Attending: Emergency Medicine | Admitting: Emergency Medicine

## 2024-10-03 DIAGNOSIS — K0889 Other specified disorders of teeth and supporting structures: Secondary | ICD-10-CM | POA: Insufficient documentation

## 2024-10-03 MED ORDER — HYDROCODONE-ACETAMINOPHEN 5-325 MG PO TABS
2.0000 | ORAL_TABLET | Freq: Once | ORAL | Status: AC
  Administered 2024-10-04: 2 via ORAL
  Filled 2024-10-03: qty 2

## 2024-10-03 MED ORDER — PENICILLIN V POTASSIUM 500 MG PO TABS: 500.0000 mg | ORAL_TABLET | Freq: Four times a day (QID) | ORAL | 0 refills | Status: AC

## 2024-10-03 MED ORDER — PENICILLIN V POTASSIUM 250 MG PO TABS
500.0000 mg | ORAL_TABLET | Freq: Once | ORAL | Status: AC
  Administered 2024-10-04: 500 mg via ORAL
  Filled 2024-10-03: qty 2

## 2024-10-03 MED ORDER — LIDOCAINE VISCOUS HCL 2 % MT SOLN: 15.0000 mL | OROMUCOSAL | 0 refills | Status: AC | PRN

## 2024-10-03 MED ORDER — NAPROXEN 500 MG PO TABS: 500.0000 mg | ORAL_TABLET | Freq: Two times a day (BID) | ORAL | 0 refills | Status: AC

## 2024-10-03 NOTE — Discharge Instructions (Signed)
Take the prescribed medication as directed. °Follow-up with dentist as soon as you can. °Return to the ED for new or worsening symptoms. °

## 2024-10-03 NOTE — ED Triage Notes (Signed)
 Dental pain x1 week on left upper side. Pt has been taking ibuprofen  without relief.

## 2024-10-03 NOTE — ED Provider Notes (Signed)
 [place] EMERGENCY DEPARTMENT AT [facility] Provider Note   CSN: [id number] Arrival date & time: 10/03/24  2102     Patient presents with: No chief complaint on file.   Jose Reid is a 28 y.o. male.   The history is provided by the patient and medical records.   28 year old male with presenting to the ED with left history of present has been ongoing all week, steadily worsening.  States he has a hole in one of his teeth.  Does not currently have a dentist or dental insurance.  Took motrin  PTA without relief.  Prior to Admission medications   Medication Sig Start Date End Date Taking? Authorizing Provider  lidocaine (XYLOCAINE) 2 % solution Use as directed 15 mLs in the mouth or throat as needed for mouth pain. 10/03/24  Yes Jarold Olam HERO, PA-C  naproxen (NAPROSYN) 500 MG tablet Take 1 tablet (500 mg total) by mouth 2 (two) times daily. 10/03/24  Yes Jarold Olam HERO, PA-C  penicillin  v potassium (VEETID) 500 MG tablet Take 1 tablet (500 mg total) by mouth 4 (four) times daily for 10 days. 10/03/24 10/13/24 Yes Jarold Olam HERO, PA-C  benzonatate  (TESSALON ) 100 MG capsule Take 1 capsule (100 mg total) by mouth every 8 (eight) hours. 06/02/18   Layden, Lindsey A, PA-C  chlorhexidine  (PERIDEX ) 0.12 % solution Use as directed 15 mLs in the mouth or throat 2 (two) times daily. 07/29/21   Kehrli, Kelsey F, PA-C  cyclobenzaprine  (FLEXERIL ) 5 MG tablet Take 1 tablet (5 mg total) by mouth 3 (three) times daily as needed. 10/23/22   Patt Alm Macho, MD  fluticasone  (FLONASE ) 50 MCG/ACT nasal spray Place 1 spray into both nostrils daily. 06/02/18   Layden, Lindsey A, PA-C  HYDROcodone-acetaminophen  (NORCO/VICODIN) 5-325 MG tablet Take 2 tablets by mouth every 4 (four) hours as needed. 09/11/24   Myriam Dorn BROCKS, PA  ibuprofen  (ADVIL ) 800 MG tablet Take 1 tablet (800 mg total) by mouth 3 (three) times daily. 08/02/21   Vicky Lamar, PA-C  oxyCODONE -acetaminophen   (PERCOCET/ROXICET) 5-325 MG tablet Take 1 tablet by mouth every 6 (six) hours as needed for severe pain (pain score 7-10). 07/06/24   Jerrol Agent, MD    Allergies: Patient has no known allergies.    Review of Systems  HENT:  Positive for dental problem.   All other systems reviewed and are negative.   Updated Vital Signs BP 123/70 (BP Location: Right Arm)   Pulse (!) 52   Temp 99.1 F (37.3 C) (Oral)   Resp 18   SpO2 96%   Physical Exam Vitals and nursing note reviewed.  Constitutional:      Appearance: He is well-developed.  HENT:     Head: Normocephalic and atraumatic.     Mouth/Throat:      Comments: Teeth largely in fair dentition, left upper first molar with central defect, surrounding gingiva normal in appearance without drainable abscess or fluid collection, handling secretions appropriately, no trismus, no facial or neck swelling, normal phonation without stridor Eyes:     Conjunctiva/sclera: Conjunctivae normal.     Pupils: Pupils are equal, round, and reactive to light.  Cardiovascular:     Rate and Rhythm: Normal rate and regular rhythm.     Heart sounds: Normal heart sounds.  Pulmonary:     Effort: Pulmonary effort is normal.     Breath sounds: Normal breath sounds.  Abdominal:     General: Bowel sounds are normal.  Palpations: Abdomen is soft.  Musculoskeletal:        General: Normal range of motion.     Cervical back: Normal range of motion.  Skin:    General: Skin is warm and dry.  Neurological:     Mental Status: He is alert and oriented to person, place, and time.     (all labs ordered are listed, but only abnormal results are displayed) Labs Reviewed - No data to display  EKG: None  Radiology: No results found.   Procedures   Medications Ordered in the ED  HYDROcodone-acetaminophen  (NORCO/VICODIN) 5-325 MG per tablet 2 tablet (has no administration in time range)  penicillin  v potassium (VEETID) tablet 500 mg (has no administration  in time range)                                    Medical Decision Making Risk Prescription drug management.   28 year old male who with left upper dental pain.  Has obvious defect to the left upper first molar.  There is no abscess or fluid collection present.  No facial or neck swelling, handling secretions well, no stridor.  Not clinically concerning for Ludwig's angina.  Will refer to dentist for follow-up.  Antibiotics and pain control sent to pharmacy.  Can return here for new concerns.  Final diagnoses:  Pain, dental    ED Discharge Orders          Ordered    naproxen (NAPROSYN) 500 MG tablet  2 times daily        10/03/24 2344    lidocaine (XYLOCAINE) 2 % solution  As needed        10/03/24 2344    penicillin  v potassium (VEETID) 500 MG tablet  4 times daily        10/03/24 2344               Jarold Olam HERO, PA-C 10/03/24 2348    Carita Senior, MD 10/04/24 [phone]

## 2024-10-04 NOTE — ED Notes (Signed)
Patient verbalizes understanding of discharge instructions. Opportunity for questioning and answers were provided. Armband removed by staff, pt discharged from ED. Ambulated out to lobby
